# Patient Record
Sex: Female | Born: 1970 | Race: Asian | Hispanic: No | Marital: Single | Smoking: Never smoker
Health system: Southern US, Community
[De-identification: ages and names within clinical notes are randomized; demographics above are authoritative.]

## PROBLEM LIST (undated history)

## (undated) DIAGNOSIS — Z860100 Personal history of colon polyps, unspecified: Secondary | ICD-10-CM

## (undated) DIAGNOSIS — K648 Other hemorrhoids: Secondary | ICD-10-CM

## (undated) DIAGNOSIS — T7840XA Allergy, unspecified, initial encounter: Secondary | ICD-10-CM

## (undated) DIAGNOSIS — K219 Gastro-esophageal reflux disease without esophagitis: Secondary | ICD-10-CM

## (undated) DIAGNOSIS — K760 Fatty (change of) liver, not elsewhere classified: Secondary | ICD-10-CM

## (undated) HISTORY — DX: Other hemorrhoids: K64.8

## (undated) HISTORY — DX: Gastro-esophageal reflux disease without esophagitis: K21.9

## (undated) HISTORY — DX: Fatty (change of) liver, not elsewhere classified: K76.0

## (undated) HISTORY — DX: Personal history of colon polyps, unspecified: Z86.0100

## (undated) HISTORY — DX: Personal history of colonic polyps: Z86.010

## (undated) HISTORY — DX: Allergy, unspecified, initial encounter: T78.40XA

---

## 2009-03-25 ENCOUNTER — Ambulatory Visit: Payer: Self-pay | Admitting: Internal Medicine

## 2009-03-25 DIAGNOSIS — J309 Allergic rhinitis, unspecified: Secondary | ICD-10-CM | POA: Insufficient documentation

## 2009-03-25 DIAGNOSIS — R1084 Generalized abdominal pain: Secondary | ICD-10-CM

## 2009-03-25 DIAGNOSIS — K625 Hemorrhage of anus and rectum: Secondary | ICD-10-CM

## 2009-03-25 LAB — CONVERTED CEMR LAB
ALT: 104 units/L — ABNORMAL HIGH (ref 0–35)
AST: 58 units/L — ABNORMAL HIGH (ref 0–37)
CO2: 26 meq/L (ref 19–32)
Calcium: 8.8 mg/dL (ref 8.4–10.5)
Creatinine, Ser: 0.43 mg/dL (ref 0.40–1.20)
Eosinophils Relative: 1 % (ref 0–5)
Glucose, Bld: 89 mg/dL (ref 70–99)
HCT: 39.5 % (ref 36.0–46.0)
Hemoglobin: 13.1 g/dL (ref 12.0–15.0)
Indirect Bilirubin: 0.5 mg/dL (ref 0.0–0.9)
Monocytes Relative: 7 % (ref 3–12)
Neutro Abs: 2.6 10*3/uL (ref 1.7–7.7)
RDW: 13 % (ref 11.5–15.5)
Total Bilirubin: 0.6 mg/dL (ref 0.3–1.2)
Total Protein: 8 g/dL (ref 6.0–8.3)
WBC: 5.8 10*3/uL (ref 4.0–10.5)

## 2009-03-26 ENCOUNTER — Encounter: Payer: Self-pay | Admitting: Internal Medicine

## 2009-03-26 ENCOUNTER — Telehealth: Payer: Self-pay | Admitting: Internal Medicine

## 2009-03-26 DIAGNOSIS — R740 Nonspecific elevation of levels of transaminase and lactic acid dehydrogenase [LDH]: Secondary | ICD-10-CM

## 2009-03-26 LAB — CONVERTED CEMR LAB
Saturation Ratios: 25 % (ref 20–55)
TIBC: 382 ug/dL (ref 250–470)
UIBC: 287 ug/dL

## 2009-03-29 ENCOUNTER — Encounter (INDEPENDENT_AMBULATORY_CARE_PROVIDER_SITE_OTHER): Payer: Self-pay | Admitting: *Deleted

## 2009-04-06 ENCOUNTER — Ambulatory Visit: Payer: Self-pay | Admitting: Diagnostic Radiology

## 2009-04-06 ENCOUNTER — Ambulatory Visit (HOSPITAL_BASED_OUTPATIENT_CLINIC_OR_DEPARTMENT_OTHER): Admission: RE | Admit: 2009-04-06 | Discharge: 2009-04-06 | Payer: Self-pay | Admitting: Internal Medicine

## 2009-04-27 ENCOUNTER — Ambulatory Visit: Payer: Self-pay | Admitting: Internal Medicine

## 2009-04-27 ENCOUNTER — Telehealth: Payer: Self-pay | Admitting: Gastroenterology

## 2009-04-27 DIAGNOSIS — K7689 Other specified diseases of liver: Secondary | ICD-10-CM

## 2009-04-27 DIAGNOSIS — N390 Urinary tract infection, site not specified: Secondary | ICD-10-CM

## 2009-04-27 DIAGNOSIS — A048 Other specified bacterial intestinal infections: Secondary | ICD-10-CM | POA: Insufficient documentation

## 2009-04-27 LAB — CONVERTED CEMR LAB
Bilirubin Urine: NEGATIVE
Glucose, Urine, Semiquant: NEGATIVE
Nitrite: NEGATIVE
Protein, U semiquant: NEGATIVE
Urobilinogen, UA: 0.2
pH: 6.5

## 2009-04-28 ENCOUNTER — Encounter: Payer: Self-pay | Admitting: Internal Medicine

## 2009-04-29 ENCOUNTER — Ambulatory Visit: Payer: Self-pay | Admitting: Internal Medicine

## 2009-04-29 DIAGNOSIS — R195 Other fecal abnormalities: Secondary | ICD-10-CM

## 2009-04-29 DIAGNOSIS — R935 Abnormal findings on diagnostic imaging of other abdominal regions, including retroperitoneum: Secondary | ICD-10-CM

## 2009-04-29 DIAGNOSIS — R1013 Epigastric pain: Secondary | ICD-10-CM | POA: Insufficient documentation

## 2009-05-03 LAB — CONVERTED CEMR LAB
Bilirubin, Direct: 0.1 mg/dL (ref 0.0–0.3)
HDL: 32.7 mg/dL — ABNORMAL LOW (ref >39.00)
LDL Cholesterol: 88 mg/dL (ref 0–99)
Total Bilirubin: 0.9 mg/dL (ref 0.3–1.2)
Total CHOL/HDL Ratio: 5
Triglycerides: 165 mg/dL — ABNORMAL HIGH (ref 0.0–149.0)
VLDL: 33 mg/dL (ref 0.0–40.0)

## 2009-05-10 ENCOUNTER — Ambulatory Visit: Payer: Self-pay | Admitting: Internal Medicine

## 2009-05-10 ENCOUNTER — Ambulatory Visit (HOSPITAL_BASED_OUTPATIENT_CLINIC_OR_DEPARTMENT_OTHER): Admission: RE | Admit: 2009-05-10 | Discharge: 2009-05-10 | Payer: Self-pay | Admitting: Internal Medicine

## 2009-05-10 ENCOUNTER — Telehealth: Payer: Self-pay | Admitting: Internal Medicine

## 2009-05-10 ENCOUNTER — Ambulatory Visit: Payer: Self-pay | Admitting: Diagnostic Radiology

## 2009-05-10 DIAGNOSIS — R109 Unspecified abdominal pain: Secondary | ICD-10-CM

## 2009-05-10 LAB — CONVERTED CEMR LAB
Specific Gravity, Urine: <1.005
Urobilinogen, UA: 0.2
WBC Urine, dipstick: NEGATIVE
pH: 7.5

## 2009-05-11 ENCOUNTER — Telehealth: Payer: Self-pay | Admitting: Internal Medicine

## 2009-06-07 ENCOUNTER — Telehealth: Payer: Self-pay | Admitting: Internal Medicine

## 2009-06-28 ENCOUNTER — Telehealth: Payer: Self-pay | Admitting: Internal Medicine

## 2009-07-19 ENCOUNTER — Ambulatory Visit: Payer: Self-pay | Admitting: Internal Medicine

## 2009-07-20 ENCOUNTER — Encounter: Payer: Self-pay | Admitting: Internal Medicine

## 2010-01-11 ENCOUNTER — Ambulatory Visit: Admit: 2010-01-11 | Payer: Self-pay | Admitting: Internal Medicine

## 2010-02-01 NOTE — Miscellaneous (Signed)
Summary: nexium rx.  Clinical Lists Changes  Medications: Added new medication of NEXIUM 40 MG  CPDR (ESOMEPRAZOLE MAGNESIUM) 1 capsule each day 30 minutes before meal - Signed Rx of NEXIUM 40 MG  CPDR (ESOMEPRAZOLE MAGNESIUM) 1 capsule each day 30 minutes before meal;  #30 x 11;  Signed;  Entered by: Darlyn Read RN;  Authorized by: Hilarie Fredrickson MD;  Method used: Electronically to [facility] [address], [place], [place]  [address], Ph: [phone], Fax: [phone]    Prescriptions: NEXIUM 40 MG  CPDR (ESOMEPRAZOLE MAGNESIUM) 1 capsule each day 30 minutes before meal  #30 x 11   Entered by:   Darlyn Read RN   Authorized by:   Hilarie Fredrickson MD   Signed by:   Darlyn Read RN on 07/19/2009   Method used:   Electronically to        [facility] [address] (retail)       [address]       [place], [place]  [address]       Ph: [phone]       Fax: [phone]   RxID:   [id number]

## 2010-02-01 NOTE — Letter (Signed)
Summary: [facility] Instructions  [facility]  [address] [place], [place] [address]   Phone: [phone]  Fax: [phone]       Rachael Harmon    1970/05/19    MRN: [id number]        Procedure Day /Date:06-07-09     Arrival Time: 10 AM     Procedure Time: 11:00 AM     Location of Procedure:                    X    [facility] (4th Floor)                       PREPARATION FOR COLONOSCOPY WITH MOVIPREP   Starting 5 days prior to your procedure 06-02-09 do not eat nuts, seeds, popcorn, corn, beans, peas,  salads, or any raw vegetables.  Do not take any fiber supplements (e.g. Metamucil, Citrucel, and Benefiber).  THE DAY BEFORE YOUR PROCEDURE         DATE: 06-06-09 DAY: Sunday  1.  Drink clear liquids the entire day-NO SOLID FOOD  2.  Do not drink anything colored red or purple.  Avoid juices with pulp.  No orange juice.  3.  Drink at least 64 oz. (8 glasses) of fluid/clear liquids during the day to prevent dehydration and help the prep work efficiently.  CLEAR LIQUIDS INCLUDE: Water Jello Ice Popsicles Tea (sugar ok, no milk/cream) Powdered fruit flavored drinks Coffee (sugar ok, no milk/cream) Gatorade Juice: apple, white grape, white cranberry  Lemonade Clear bullion, consomm, broth Carbonated beverages (any kind) Strained chicken noodle soup Hard Candy                             4.  In the morning, mix first dose of MoviPrep solution:    Empty 1 Pouch A and 1 Pouch B into the disposable container    Add lukewarm drinking water to the top line of the container. Mix to dissolve    Refrigerate (mixed solution should be used within 24 hrs)  5.  Begin drinking the prep at 5:00 p.m. The MoviPrep container is divided by 4 marks.   Every 15 minutes drink the solution down to the next mark (approximately 8 oz) until the full liter is complete.   6.  Follow completed prep with 16 oz of clear liquid of your choice (Nothing red or purple).  Continue  to drink clear liquids until bedtime.  7.  Before going to bed, mix second dose of MoviPrep solution:    Empty 1 Pouch A and 1 Pouch B into the disposable container    Add lukewarm drinking water to the top line of the container. Mix to dissolve    Refrigerate  THE DAY OF YOUR PROCEDURE      DATE: 06-07-09 DAY: Monday  Beginning at 6:00 AM  (5 hours before procedure):         1. Every 15 minutes, drink the solution down to the next mark (approx 8 oz) until the full liter is complete.  2. Follow completed prep with 16 oz. of clear liquid of your choice.    3. You may drink clear liquids until 9:00 AM  (2 HOURS BEFORE PROCEDURE).   MEDICATION INSTRUCTIONS  Unless otherwise instructed, you should take regular prescription medications with a small sip of water   as early as possible the morning of your  procedure.  Diabetic patients - see separate instructions.  Stop taking Plavix or Aggrenox on  _  _  (7 days before procedure).     Stop taking Coumadin on  _ _  (5 days before procedure).  Additional medication instructions: _         OTHER INSTRUCTIONS  You will need a responsible adult at least 38 years of age to accompany you and drive you home.   This person must remain in the waiting room during your procedure.  Wear loose fitting clothing that is easily removed.  Leave jewelry and other valuables at home.  However, you may wish to bring a book to read or  an iPod/MP3 player to listen to music as you wait for your procedure to start.  Remove all body piercing jewelry and leave at home.  Total time from sign-in until discharge is approximately 2-3 hours.  You should go home directly after your procedure and rest.  You can resume normal activities the  day after your procedure.  The day of your procedure you should not:   Drive   Make legal decisions   Operate machinery   Drink alcohol   Return to work  You will receive specific instructions about  eating, activities and medications before you leave.    The above instructions have been reviewed and explained to me by   _______________________    I fully understand and can verbalize these instructions _____________________________ Date _________

## 2010-02-01 NOTE — Procedures (Signed)
Summary: Colonoscopy  Patient: [name] Note: All result statuses are Final unless otherwise noted.  Tests: (1) Colonoscopy (COL)   COL Colonoscopy           DONE     [facility]     [address]     [place], [place]  [address]           COLONOSCOPY PROCEDURE REPORT           PATIENT:  Rachael Harmon, Rachael Harmon  MR#:  [id number]     BIRTHDATE:  [birth year], 39 yrs. old  GENDER:  female     ENDOSCOPIST:  Wilhemina Bonito. Eda Keys, MD     REF. BY:  Thomos Lemons, DO     PROCEDURE DATE:  07/19/2009     PROCEDURE:  Colonoscopy with snare polypectomy x 4     ASA CLASS:  Class I     INDICATIONS:  rectal bleeding, heme positive stool     MEDICATIONS:   Fentanyl 50 mcg IV, Versed 6 mg IV           DESCRIPTION OF PROCEDURE:   After the risks benefits and     alternatives of the procedure were thoroughly explained, informed     consent was obtained.  Digital rectal exam was performed and     revealed no abnormalities.   The Pentax Colonoscope [id number]     endoscope was introduced through the anus and advanced to the     cecum, which was identified by both the appendix and ileocecal     valve, without limitations.Time to cecum = 1:59 min.  The quality     of the prep was excellent, using MoviPrep.  The instrument was     then slowly withdrawn (time = 14:03 min) as the colon was fully     examined.     <<PROCEDUREIMAGES>>           FINDINGS:  Two polyps measuring 5mm and 6mm were found in the     ascending colon. Polyps were snared without cautery. Retrieval was     successful.   Two pedunculated polyps were found in the rectum     (15mm) and sigmoid colon (10mm). Polyps were snared, then     cauterized with monopolar cautery. Retrieval was successful.     This was otherwise a normal examination of the colon.  The terminal     ileum appeared normal.   Retroflexed views in the rectum revealed     no abnormalities.    The scope was then withdrawn from the patient     and the procedure completed.        COMPLICATIONS:  None     ENDOSCOPIC IMPRESSION:     1) Two polyps in the ascending colon - removed     2) Two polyps in the rectum and sigmoid colon - removed     3) Otherwise normal examination     4) Normal terminal ileum           RECOMMENDATIONS:     1) Follow up colonoscopy in 3 years     2) YOUR FIRST DEGREE RELATIVES AGE 44 OR OLDER SHOULD BE SCREENED     FOR COLON CANCER AND COLON POLYPS           ______________________________     Wilhemina Bonito. Eda Keys, MD           CC:  Thomos Lemons, DO; The Patient  n.     eSIGNED:   Wilhemina Bonito. Eda Keys at 07/19/2009 11:50 AM           Marcelline Mates, [id number]  Note: An exclamation mark (!) indicates a result that was not dispersed into the flowsheet. Document Creation Date: 07/19/2009 11:52 AM _______________________________________________________________________  (1) Order result status: Final Collection or observation date-time: 07/19/2009 11:41 Requested date-time:  Receipt date-time:  Reported date-time:  Referring Physician:   Ordering Physician: Fransico Setters [phone]) Specimen Source:  Source: Launa Grill Order Number: [id number] Lab site:   Appended Document: Colonoscopy recall 3 yrs     Procedures Next Due Date:    Colonoscopy: 07/2012

## 2010-02-01 NOTE — Assessment & Plan Note (Signed)
Summary: rectal bldg / H.Pylori / LFT'S   History of Present Illness Visit Type: new patient  Primary GI MD: Yancey Flemings MD Primary Provider: Dondra Spry DO Requesting Provider: n/a Chief Complaint: Consult colon. Pt c/o generalized abd pain, lower back pain, and rectal bleeding. History of Present Illness:   PLEASANT 40 Y.O CHINESE FEMALE,WHO IS NEW TO GI TODAY. SHE COMES WITH AN INTERPRETER. SHE HAS LIVED IN THE [place] 12 YEARS, AND DOES UNDERSTAND SOME ENGLISH. SHE HAS C/O INTERMITTENT RECTAL BLEEDING-REPORTS SEEING BRB ON TNE TISSUE AND MIXED WITH HER STOOL 5 X OVER THE PAST MONTH, HAS SEEN LESS FREQUENTLY OVER THE PAST 6 MONTHS. BM'S HAVE BEEN NORMAL. HER APPETITE IS GOOD, WEIGHT HAS BEEN STABLE. SHE ASLO C/O EPIGASTRIC AND SUBXYPHOID PAIN. SHE HAD LABS DONE THRU DR. YOO ,AND H.PYLORI AB TITER IS HIGH, LFTS SHOW MILD TRANSAMINITIS WITH SGOT58,SGPT104,CBC NORMAL.  ABDOMINAL US SHOWS FATTY CHANGES IN THE LIVER, A GB POLYP, PANCREAS NOT WELL SEEN.   GI Review of Systems    Reports abdominal pain, belching, and  heartburn.     Location of  Abdominal pain: epigastric area.    Denies acid reflux, bloating, chest pain, dysphagia with liquids, dysphagia with solids, loss of appetite, nausea, vomiting, vomiting blood, and  weight loss.      Reports heme positive stool and  rectal bleeding.     Denies anal fissure, black tarry stools, change in bowel habit, constipation, diarrhea, diverticulosis, fecal incontinence, hemorrhoids, irritable bowel syndrome, jaundice, light color stool, liver problems, and  rectal pain.    Current Medications (verified): 1)  Nasonex 50 Mcg/act Susp (Mometasone Furoate) .... 2 Sprays Each Nostril Once Daily 2)  Cefuroxime Axetil 500 Mg Tabs (Cefuroxime Axetil) .... One By Mouth Two Times A Day  Allergies (verified): No Known Drug Allergies  Past History:  Past Medical History:   ALLERGIC RHINITIS (ICD-477.9)  Past Surgical History: Unremarkable  Family  History: Reviewed history from 03/25/2009 and no changes required. Father has liver cyst/tumor? Family History High cholesterol - mother  Family History Hypertension - mother no colon ca no stomach ca  Social History: Reviewed history from 03/25/2009 and no changes required. Occupation: Conservation officer, nature in [employer] Single  1 son  age 61 - lives with grandparents in [place] Never Smoked Alcohol use-no  Review of Systems       The patient complains of back pain.  The patient denies allergy/sinus, anemia, anxiety-new, arthritis/joint pain, blood in urine, breast changes/lumps, change in vision, confusion, cough, coughing up blood, depression-new, fainting, fatigue, fever, headaches-new, hearing problems, heart murmur, heart rhythm changes, itching, menstrual pain, muscle pains/cramps, night sweats, nosebleeds, pregnancy symptoms, shortness of breath, skin rash, sleeping problems, sore throat, swelling of feet/legs, swollen lymph glands, thirst - excessive , urination - excessive , urination changes/pain, urine leakage, vision changes, and voice change.         ROS OTHERWISE AS IN HPI  Vital Signs:  Patient profile:   40 year old female Height:      62.5 inches Weight:      144 pounds BMI:     26.01 BSA:     1.67 Pulse rate:   64 / minute Pulse rhythm:   regular BP sitting:   100 / 76  (left arm) Cuff size:   regular  Vitals Entered By: Ok Anis CMA (April 29, 2009 9:35 AM)  Physical Exam  General:  Well developed, well nourished, no acute distress.short statured.   Head:  Normocephalic and  atraumatic. Eyes:  PERRLA, no icterus. Lungs:  Clear throughout to auscultation. Heart:  Regular rate and rhythm; no murmurs, rubs,  or bruits. Abdomen:  SOFT, MOLD TENDERNESS IN EPIGASTRIUM, NO GUARDING OR REBOUND, NO PALP MASS OR HSM,BS+ Rectal:  NOT REPEATED, HEME POSITIVE PER DR. Charmayne Sheer HEMORRHOIDS Extremities:  No clubbing, cyanosis, edema or deformities noted. Neurologic:  Alert and   oriented x4;  grossly normal neurologically. Psych:  Alert and cooperative. Normal mood and affect.   Impression & Recommendations:  Problem # 1:  FECAL OCCULT BLOOD (ICD-792.1) Assessment New 39 YO FEMALE WITH INTERMITTENT RECTAL BLEEDING X 6 MONTHS,WITH HEME POSITIVE STOOL ON EXAM . R/O HEMORRHOIDAL SOURCE ,R/O OCCULT LESION.  SCHEDULE FOR COLONOSCOPY WITH DR. PERRY. PROCEDURE WAS DISCUSSED IN DETAIL WITH THE PATIENT VIA HER INTERPRETER. THE INTERPRETER WILL ACCOMPANY HER ON THE DAY OF EXAM AS WELL.  Problem # 2:  ABDOMINAL PAIN, EPIGASTRIC (ICD-789.06) Assessment: New SEVERAL MONTH HX OF INTERMITTENT EPIGASTRIC /SUBXYPHOID DISCOMFORT WITH + H.PYLORI STATUS. R/O GASTRITIS,PUD,GERD.  START TRIAL OF NEXIUM 40 MG ONCE DAILY IN AM -SAMPES GIVEN SCHEDULE FOR UPPER ENDOSCOPY WITH DR. PERRY. PROCEDURE DISCUSSED IN DETAIL WITH PT. Orders: Colon/Endo (Colon/Endo)  Problem # 3:  TRANSAMINASES, SERUM, ELEVATED (ICD-790.4) Assessment: Unchanged MILD TRANSAMINITIS OF UNCERTAIN DURATION,WITH INCREASED ECHOGENICITY OF LIVER ON [place]. R/O NASH, R/O OTHER UNDERLYING LIVER DISEASE.  HEPATITS  B AND C SEROLOGIES NEGATIVE  LABS AS BELOW Orders: Colon/Endo (Colon/Endo) TLB-Lipid Panel (80061-LIPID) TLB-Hepatic/Liver Function Pnl (80076-HEPATIC) TLB-CRP-High Sensitivity (C-Reactive Protein) (86140-FCRP) T-Alpha-1-Antitrypsin Tot ([address]) T-AMA [phone]) Jackie Plum ([id number])  Other Orders: T-Anti SMA ([id number]) T-Ceruloplasmin ([id number])  Patient Instructions: 1)  Your physician has requested that you have the following labwork done today: Go to lab , basement level. 2)  We have given you Nexium samples, take 1 capsule 30 min before breakfast. 3)  We sent perscription for the Colonoscopy prep to your pharmacy, [facility]. 4)  We scheduled the Endoscopy/Colonoscopy with Dr. Marina Goodell for 06-07-09 at 11Am. 5)  Directions and brochure given. 6)  Copy sent to :  D. Thomos Lemons, DO 7)  The medication list was reviewed and reconciled.  All changed / newly prescribed medications were explained.  A complete medication list was provided to the patient / caregiver. Prescriptions: NEXIUM 40 MG CPDR (ESOMEPRAZOLE MAGNESIUM) Take 1 capsule 30 min before breakfast  #30 x 3   Entered by:   Lowry Ram NCMA   Authorized by:   Sammuel Cooper PA-c   Signed by:   Lowry Ram NCMA on 04/29/2009   Method used:   Electronically to        [facility] [address] (retail)       [address]       [place], [place]  [address]       Ph: [phone]       Fax: [phone]   RxID:   [id number] MOVIPREP 100 GM  SOLR (PEG-KCL-NACL-NASULF-NA ASC-C) As per prep instructions.  #1 x 0   Entered by:   Lowry Ram NCMA   Authorized by:   Sammuel Cooper PA-c   Signed by:   Lowry Ram NCMA on 04/29/2009   Method used:   Electronically to        [facility] [address] (retail)       [address]  [place], [place]  [address]       Ph: [phone]       Fax: [phone]   RxID:   [id number]

## 2010-02-01 NOTE — Assessment & Plan Note (Signed)
Summary: new to be est, npx- jr   Vital Signs:  Patient profile:   40 year old Harmon Height:      62.5 inches Weight:      144.50 pounds BMI:     26.10 O2 Sat:      100 % on Room air Temp:     97.9 degrees F oral Pulse rate:   65 / minute Pulse rhythm:   regular Resp:     16 per minute BP sitting:   104 / 74  (right arm) Cuff size:   regular  Vitals Entered By: Glendell Docker CMA (March 25, 2009 9:35 AM)  O2 Flow:  Room air CC: Rm 2- New Patient Comments blood in stool for the past 6 months- denies constipation, urinary incontinence-cough,laughing and sneezing, fasting for labs   Primary Care Provider:  Dondra Spry DO  CC:  Rm 2- New Patient.  History of Present Illness: Rachael Harmon to establish No previous PCP moved to [place] 12 yrs ago  She c/o blood tinged stools,  onset 5-6 months tissue is stained with blood,  small amt of blood in commode  c/o upper intermittent upper abd pain, feels like something is blocked no dyphagia no NSAID use  she c/o chronic nasal congestion throat is filled with mucus mucus can be blood tinged some nose bleeds   Preventive Screening-Counseling & Management  Alcohol-Tobacco     Alcohol drinks/day: 0     Smoking Status: never  Caffeine-Diet-Exercise     Caffeine use/day: None     Does Patient Exercise: no  Allergies (verified): No Known Drug Allergies  Family History: Father has liver cyst/tumor? Family History High cholesterol - mother  Family History Hypertension - mother no colon ca no stomach ca  Social History: Occupation: Geographical information systems officer Single  1 son  age 56 - lives with grandparents in [place] Never Smoked Alcohol use-no Smoking Status:  never Caffeine use/day:  None Does Patient Exercise:  no  Review of Systems       no difficulty swallowing,  no dysphagia occasional thoracic back pain  Physical Exam  General:  alert, well-developed, and well-nourished.   Ears:  R ear normal and L ear  normal.   Nose:  nasal dischargemucosal pallor and mucosal edema.   Mouth:  mild pharyngeal erythema and postnasal drip.   Neck:  supple, no masses, and no thyromegaly.   Lungs:  normal respiratory effort, normal breath sounds, no crackles, and no wheezes.   Heart:  normal rate, regular rhythm, no murmur, and no gallop.   Abdomen:  soft and no masses.  mild left upper quad and right upper quad tenderness.  mild right lower quad tenderness Rectal:  normal sphincter tone, no masses, small external hemorrhoid(s), and small internal hemorrhoid(s).  stool negative for occult blood   Extremities:  No lower extremity edema  Neurologic:  cranial nerves II-XII intact and gait normal.   Psych:  normally interactive, good eye contact, not anxious appearing, and not depressed appearing.     Impression & Recommendations:  Problem # 1:  ABDOMINAL PAIN, GENERALIZED (ICD-789.07) 40 y/o with intermittent abd pain / dyspepsia.  trial of otc zantac.  screen for H. Pylori Orders: T-Basic Metabolic Panel [phone]) T-Hepatic Function [id number]) T-CBC w/Diff [id number]) T-TSH [phone]) T- * Misc. Laboratory test [phone])  Problem # 2:  RECTAL BLEEDING (ICD-569.3) Pt with intermittent rectal bleeding.  I suspect bleeding from hemorrhoids.  increase fiber.  use hemorrhoid cream.  If persistent symptoms, refer to GI  Problem # 3:  ALLERGIC RHINITIS (ICD-477.9)  Her updated medication list for this problem includes:    Fexofenadine Hcl 180 Mg Tabs (Fexofenadine hcl) ..... One by mouth once daily    Nasonex 50 Mcg/act Susp (Mometasone furoate) .Marland Kitchen... 2 sprays each nostril once daily  Discussed use of allergy medications and environmental measures.   Complete Medication List: 1)  Fexofenadine Hcl 180 Mg Tabs (Fexofenadine hcl) .... One by mouth once daily 2)  Nasonex 50 Mcg/act Susp (Mometasone furoate) .... 2 sprays each nostril once daily 3)  Ranitidine Hcl 150 Mg Tabs (Ranitidine hcl) ....  One by mouth two times a day 4)  Hydrocortisone Ace-pramoxine 1-1 % Crea (Hydrocortisone ace-pramoxine) .... Apply to rectal area 2 x per day x  1 week  Patient Instructions: 1)  Use zantac(ranitidine) 150 mg by mouth two times a day 2)  Use fiber supplement (metamucile)  1 tsp once daily with 8 oz of water once daily 3)  Please schedule a follow-up appointment in 1 month. 4)  Wash rectal area with warm water and soap after bowel movement Prescriptions: HYDROCORTISONE ACE-PRAMOXINE 1-1 % CREA (HYDROCORTISONE ACE-PRAMOXINE) apply to rectal area 2 x per day x  1 week  #30 grams x 0   Entered and Authorized by:   D. Thomos Lemons DO   Signed by:   D. Thomos Lemons DO on 03/25/2009   Method used:   Print then Give to Patient   RxID:   [id number] RANITIDINE HCL 150 MG TABS (RANITIDINE HCL) one by mouth two times a day  #60 x 2   Entered and Authorized by:   D. Thomos Lemons DO   Signed by:   D. Thomos Lemons DO on 03/25/2009   Method used:   Print then Give to Patient   RxID:   [id number] NASONEX 50 MCG/ACT SUSP (MOMETASONE FUROATE) 2 sprays each nostril once daily  #1 x 3   Entered and Authorized by:   D. Thomos Lemons DO   Signed by:   D. Thomos Lemons DO on 03/25/2009   Method used:   Print then Give to Patient   RxID:   [id number] FEXOFENADINE HCL 180 MG TABS (FEXOFENADINE HCL) one by mouth once daily  #30 x 5   Entered and Authorized by:   D. Thomos Lemons DO   Signed by:   D. Thomos Lemons DO on 03/25/2009   Method used:   Print then Give to Patient   RxID:   [id number]    Preventive Care Screening  Pap Smear:    Date:  10/20/2008    Results:  normal    Current Allergies (reviewed today): No known allergies

## 2010-02-01 NOTE — Procedures (Signed)
Summary: Upper Endoscopy  Patient: [name] Note: All result statuses are Final unless otherwise noted.  Tests: (1) Upper Endoscopy (EGD)   EGD Upper Endoscopy       DONE     [facility]     [address]     [place], [place]  [address]           ENDOSCOPY PROCEDURE REPORT           PATIENT:  Rachael Harmon, Rachael Harmon  MR#:  [id number]     BIRTHDATE:  24-Feb-1970, 39 yrs. old  GENDER:  female           ENDOSCOPIST:  Wilhemina Bonito. Eda Keys, MD     Referred by:  Thomos Lemons, DO           PROCEDURE DATE:  07/19/2009     PROCEDURE:  EGD with biopsy     ASA CLASS:  Class I     INDICATIONS:  epigastric pain ; has GERD; improved w/ meals;     elevated H.pylori titer           MEDICATIONS:   There was residual sedation effect present from     prior procedure., Fentanyl 25 mcg IV, Versed 2 mg IV     TOPICAL ANESTHETIC:  Exactacain Spray           DESCRIPTION OF PROCEDURE:   After the risks benefits and     alternatives of the procedure were thoroughly explained, informed     consent was obtained.  The LB [id number] endoscope was     introduced through the mouth and advanced to the third portion of     the duodenum, without limitations.  The instrument was slowly     withdrawn as the mucosa was fully examined.     <<PROCEDUREIMAGES>>           The upper, middle, and distal third of the esophagus were     carefully inspected and no abnormalities were noted. The z-line     was well seen at the GEJ. The endoscope was pushed into the fundus     which was normal including a retroflexed view. The antrum,gastric     body, first and second part of the duodenum were essentially     unremarkable. There was an incidental 9mm submucosal antral nodule     present.   Retroflexed views revealed no abnormalities.    The     scope was then withdrawn from the patient and the procedure     completed.           COMPLICATIONS:  None           ENDOSCOPIC IMPRESSION:     1) Essentially Normal EGD     2) Small  incidental antral nodule     3) Gerd           RECOMMENDATIONS:     1) Continue PPI (Nexium) daily     2) Rx CLO if positive     3) Reflux precautions instructions           _____________________________     Wilhemina Bonito. Eda Keys, MD           CC:  Thomos Lemons, DO, The Patient           n.     eSIGNED:   Wilhemina Bonito. Eda Keys at 07/19/2009 12:03 PM           [name],  Tai, [id number]  Note: An exclamation mark (!) indicates a result that was not dispersed into the flowsheet. Document Creation Date: 07/19/2009 12:04 PM _______________________________________________________________________  (1) Order result status: Final Collection or observation date-time: 07/19/2009 11:54 Requested date-time:  Receipt date-time:  Reported date-time:  Referring Physician:   Ordering Physician: Fransico Setters [phone]) Specimen Source:  Source: Launa Grill Order Number: [id number] Lab site:

## 2010-02-01 NOTE — Progress Notes (Signed)
Summary: Triage  Phone Note Call from Patient   Caller: Meliton Rattan Social Work @ [facility] [phone] Call For: Dr. Marina Goodell Summary of Call: Calling about an interpreter report on this pt. Initial call taken by: Karna Christmas,  June 28, 2009 12:15 PM  Follow-up for Phone Call        Asking if there  were  any problems with intrepreter at procedure. Informed pt. cx. procedure. Follow-up by: Teryl Lucy RN,  June 28, 2009 2:23 PM

## 2010-02-01 NOTE — Progress Notes (Signed)
Summary: CT Order  Phone Note Other Incoming   Caller: Cordelia Pen - Radiology Summary of Call: she states she spoke with Dr Loreta Ave and he states stones could only be seen with IV contrast, If a better evaluation of the abdomen is needed, his recommendations would be an MRI. Cordelia Pen is requesting a new order fot CT with IV contrast. Initial call taken by: Glendell Docker CMA,  May 10, 2009 4:31 PM  Follow-up for Phone Call        CT order changed Follow-up by: D. Thomos Lemons DO,  May 10, 2009 5:01 PM

## 2010-02-01 NOTE — Progress Notes (Signed)
Summary: CT result  Phone Note Outgoing Call   Summary of Call: call pt - CT of abd and pelvis normal.  no kidney stone or othe kidney abnormality Initial call taken by: D. Thomos Lemons DO,  May 11, 2009 12:45 PM  Follow-up for Phone Call        Spoke with pt. about results and pt. requested that I contact Trey Paula (friend) @ [phone] and give him results of CT as pt. did not understand me very well.  Left results on Jeff's voicemail.  Mervin Kung CMA  May 11, 2009 4:42 PM

## 2010-02-01 NOTE — Miscellaneous (Signed)
Summary: Orders Update  Clinical Lists Changes  Orders: Added new Test order of TLB-H Pylori Screen Gastric Biopsy (83013-CLOTEST) - Signed 

## 2010-02-01 NOTE — Letter (Signed)
Summary: Patient Notice- Polyp Results  [facility]  [address] [place], [place] [address]   Phone: [phone]  Fax: [phone]        July 20, 2009 MRN: [id number]    Rachael Harmon [address] [place], [place]  [address]    Dear [name],  I am pleased to inform you that the colon polyps removed during your recent colonoscopy were found to be benign (no cancer detected) upon pathologic examination.  I recommend you have a repeat colonoscopy examination in 3 years to look for recurrent polyps, as having colon polyps increases your risk for having recurrent polyps or even colon cancer in the future.  Should you develop new or worsening symptoms of abdominal pain, bowel habit changes or bleeding from the rectum or bowels, please schedule an evaluation with either your primary care physician or with me.  Additional information/recommendations:  __ No further action with gastroenterology is needed at this time. Please      follow-up with your primary care physician for your other healthcare      needs.   Please call us if you are having persistent problems or have questions about your condition that have not been fully answered at this time.  Sincerely,  Hilarie Fredrickson MD  This letter has been electronically signed by your physician.  Appended Document: Patient Notice- Polyp Results letter mailed.

## 2010-02-01 NOTE — Assessment & Plan Note (Signed)
Summary: 1 month follow up/mhf   Vital Signs:  Patient profile:   40 year old female Height:      62.5 inches Weight:      143.75 pounds BMI:     25.97 O2 Sat:      99 % on Room air Temp:     98.2 degrees F oral Pulse rate:   68 / minute Pulse rhythm:   regular Resp:     16 per minute BP sitting:   100 / 70  (right arm) Cuff size:   regular  Vitals Entered By: Glendell Docker CMA (April 27, 2009 9:05 AM)  O2 Flow:  Room air CC: Rm 3- 1 Month Follow up Comments c/o diarrhea red in color x 5 and lower back pain for the past month ,denies urinary problems   Primary Care Provider:  DThomos Lemons DO  CC:  Rm 3- 1 Month Follow up.  History of Present Illness: 40 y/o asian female for fu (history as per Nurse, learning disability) she completed course for H  Pylori general dyspepsia is better  we discussed results of blood tests - elevated LFTs neg Hep B and Hep C antibody,  normal iron saturation we reviewed liver u/s - showed fatty liver poor diet - fried foods  she also co mild low back pain no urinary symptoms  Allergies (verified): No Known Drug Allergies  Family History: Father has liver cyst/tumor? Family History High cholesterol - mother  Family History Hypertension - mother no colon ca no stomach ca  Physical Exam  General:  alert, well-developed, and well-nourished.   Lungs:  normal respiratory effort and normal breath sounds.   Heart:  normal rate, regular rhythm, no murmur, and no gallop.   Abdomen:  soft, non-tender, normal bowel sounds, no masses, no hepatomegaly, and no splenomegaly.     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  dyspepsia improved.  consider EGD to screen for gastric cancer  Orders: Gastroenterology Referral (GI)  Problem # 2:  RECTAL BLEEDING (ICD-569.3) Pt reports rectal bleeding improved after using hemorrhoid cream.  she describes persistent  blood tinged stools esp with loose BMs.  refer to GI for colonoscopy vs sigmoidoscopy  Orders: Gastroenterology Referral (GI)  Problem # 3:  UTI (ICD-599.0) Pt with mild back pain and positive UA.  tx with ceftin.  send urine for culture The following medications were removed from the medication list:    Amoxicillin 500 Mg Tabs (Amoxicillin) .Marland Kitchen... 2 tabs by mouth two times a day for 10 days    Clarithromycin 500 Mg Tabs (Clarithromycin) ..... One by mouth two times a day Her updated medication list for this problem includes:    Cefuroxime Axetil 500 Mg Tabs (Cefuroxime axetil) ..... One by mouth two times a day  Orders: T-Culture, Urine ([id number]) Specimen Handling ([address])  Complete Medication List: 1)  Nasonex 50 Mcg/act Susp (Mometasone furoate) .... 2 sprays each nostril once daily 2)  Cefuroxime Axetil 500 Mg Tabs (Cefuroxime axetil) .... One by mouth two times a day 3)  Moviprep 100 Gm Solr (Peg-kcl-nacl-nasulf-na asc-c) .... As per prep instructions. 4)  Nexium 40 Mg Cpdr (Esomeprazole magnesium) .... Take 1 capsule 30 min before breakfast  Other Orders: UA Dipstick w/o Micro (manual) (95621)  Patient Instructions: 1)  Change your diet as directed 2)  Start vitamin E 800 International Units daily 3)  Please schedule a follow-up appointment in 6 months.  4)  Hepatic Panel prior to visit, ICD-9: 571.8 5)  Please return for lab work one (1) week before your next appointment.  6)  Our office will contact you re:  referral to GI specialist Prescriptions: CEFUROXIME AXETIL 500 MG TABS (CEFUROXIME AXETIL) one by mouth two times a day  #14 x 0   Entered and Authorized by:   D. Thomos Lemons DO   Signed by:   D. Thomos Lemons DO on 04/27/2009   Method used:   Electronically to        [facility] [address] (retail)       [address]       [place], [place]  [address]       Ph: [phone]       Fax: [phone]   RxID:    [id number]   Current Allergies (reviewed today): No known allergies   Laboratory Results   Urine Tests    Routine Urinalysis   Color: yellow Appearance: Cloudy Glucose: negative   (Normal Range: Negative) Bilirubin: negative   (Normal Range: Negative) Ketone: negative   (Normal Range: Negative) Spec. Gravity: 1.015   (Normal Range: 1.003-1.035) Blood: small   (Normal Range: Negative) pH: 6.5   (Normal Range: 5.0-8.0) Protein: negative   (Normal Range: Negative) Urobilinogen: 0.2   (Normal Range: 0-1) Nitrite: negative   (Normal Range: Negative) Leukocyte Esterace: small   (Normal Range: Negative)

## 2010-02-01 NOTE — Progress Notes (Signed)
  Phone Note Outgoing Call   Summary of Call: call pt (translator(- liver enzymes are elevated.  I rec we obtain liver U/S.   Also H. Pylori antibody in positive - I suggest treatment with 2 antibiotics and omeprazole.  see rx needs f/u appt in 2 weeks Initial call taken by: D. Thomos Lemons DO,  March 26, 2009 3:42 PM  Follow-up for Phone Call        Call pt  no answer   left message for return call   Follow-up by: Darral Dash,  March 29, 2009 11:35 AM  Additional Follow-up for Phone Call Additional follow up Details #1::        Left message to return call Additional Follow-up by: Darral Dash,  March 30, 2009 7:59 AM  New Problems: TRANSAMINASES, SERUM, ELEVATED (ICD-790.4)   Additional Follow-up for Phone Call Additional follow up Details #2::    Spoke with family member  message given  U/S  sch'd  April  5  appt confirmed Follow-up by: Darral Dash,  April 01, 2009 3:24 PM  New Problems: TRANSAMINASES, SERUM, ELEVATED (ICD-790.4) New/Updated Medications: AMOXICILLIN 500 MG TABS (AMOXICILLIN) 2 tabs by mouth two times a day for 10 days CLARITHROMYCIN 500 MG TABS (CLARITHROMYCIN) one by mouth two times a day OMEPRAZOLE 20 MG CPDR (OMEPRAZOLE) one by mouth once daily 15 mins before AM meal Prescriptions: OMEPRAZOLE 20 MG CPDR (OMEPRAZOLE) one by mouth once daily 15 mins before AM meal  #30 x 2   Entered and Authorized by:   D. Thomos Lemons DO   Signed by:   D. Thomos Lemons DO on 03/26/2009   Method used:   Electronically to        [facility] [address] (retail)       [address]       [place], [place]  [address]       Ph: [phone]       Fax: [phone]   RxID:   [id number] CLARITHROMYCIN 500 MG TABS (CLARITHROMYCIN) one by mouth two times a day  #20 x 0   Entered and Authorized by:   D. Thomos Lemons DO   Signed by:   D. Thomos Lemons DO on 03/26/2009   Method used:   Electronically to        [facility] [address] (retail)       [address]       [place], [place]  [address]       Ph: [phone]       Fax: [phone]   RxID:   [id number] AMOXICILLIN 500 MG TABS (AMOXICILLIN) 2 tabs by mouth two times a day for 10 days  #40 x 0   Entered and Authorized by:   D. Thomos Lemons DO   Signed by:   D. Thomos Lemons DO on 03/26/2009   Method used:   Electronically to        [facility] [address] (retail)       [address]       [place], [place]  [address]       Ph: [phone]       Fax: [phone]   RxID:   [id number]

## 2010-02-01 NOTE — Letter (Signed)
Summary: Primary Care Consult Scheduled Letter  [place] [facility]  [address]   [place], [place] [address]   Phone: [phone]  Fax: [phone]      03/29/2009 MRN: [id number]  Rachael Harmon [address] [place], [place]  [address]    Dear [name],      We have scheduled an appointment for you.  At the recommendation of Dr. Artist Pais, we have schedulweed you a appointment at  [facility]  on [facility] [address] at [facility] .  Their address is[address], [place] [address]. The office phone number is [phone].  If this appointment day and time is not convenient for you, please feel free to call the office of the doctor you are being referred to at the number listed above and reschedule the appointment.     It is important for you to keep your scheduled appointments. We are here to make sure you are given good patient care. If you have questions or you have made changes to your appointment, please notify us at  [phone], ask for HELEN.    Thank you,  Patient Care Coordinator [place] [facility]

## 2010-02-01 NOTE — Progress Notes (Signed)
Summary: triage  Phone Note From Other Clinic Call back at [phone]   Caller: Rachael Harmon from Dr Artist Pais Call For: [name] (doc of the day) Reason for Call: Schedule Patient Appt Summary of Call: Dr Artist Pais would ilke this patient seen before first available 5-23 do to H-Pylori and rectal bleeding. Initial call taken by: Tawni Levy,  April 27, 2009 11:37 AM  Follow-up for Phone Call        No physician openings tomorrow but PA has openings.Should i schedule pt. with her? Follow-up by: Teryl Lucy RN,  April 27, 2009 1:48 PM  Additional Follow-up for Phone Call Additional follow up Details #1::        i am certain that I have openings (either rov or ngi) before 4 weeks from now.  Check friday may 13th...can offer her one of those or PA appt is also fine. Additional Follow-up by: Rachael Fee MD,  April 27, 2009 1:51 PM    Additional Follow-up for Phone Call Additional follow up Details #2::    Rachael Harmon at [facility] office  asked to have pt. scheduled this week with PA.Given appt. for Thursday.Pt.will bring translator with her and Rachael Harmon will inform pt. of visit.. Follow-up by: Teryl Lucy RN,  April 27, 2009 2:45 PM

## 2010-02-01 NOTE — Progress Notes (Signed)
Summary: cx double procedure (same day)  Phone Note Call from Patient Call back at Home Phone [phone]   Caller: brother Call For: Rachael Harmon Summary of Call: Brother called and cx double proc that was scheduled for today because patient had a family emergency, will cb to resch.  Will you charge? Initial call taken by: Tawni Levy,  June 07, 2009 8:19 AM  Follow-up for Phone Call        No, but it is important that she reschedules in the near future  Follow-up by: Hilarie Fredrickson MD,  June 07, 2009 8:28 AM  Additional Follow-up for Phone Call Additional follow up Details #1::        Patient NOT BILLED. Additional Follow-up by: Leanor Kail [facility],  June 18, 2009 10:23 AM

## 2010-02-01 NOTE — Assessment & Plan Note (Signed)
Summary: ?strep test/not sure /hea   Vital Signs:  Patient profile:   40 year old female Height:      62.5 inches Weight:      144 pounds BMI:     26.01 O2 Sat:      98 % on Room air Temp:     98.0 degrees F oral Pulse rate:   90 / minute Pulse rhythm:   regular Resp:     18 per minute BP sitting:   108 / 78  (right arm) Cuff size:   large  Vitals Entered By: Glendell Docker CMA (May 10, 2009 3:00 PM)  O2 Flow:  Room air CC: Rm 3- Low Back Pain, Back pain Is Patient Diabetic? No Pain Assessment Patient in pain? yes     Location: lower back Intensity: 9 Type: stinging Onset of pain  Constant   Primary Care Provider:  Dondra Spry DO  CC:  Rm 3- Low Back Pain and Back pain.  History of Present Illness:  Back Pain      This is a 40 year old woman who presents with Back pain.  The patient denies fever and chills.  The pain is located in the right low back and left low back.  she notes urinary freq x 5 days some relief with aleve  elevated LFTs , rectal bleeding,  GERD - GI notes reviewed    Allergies: No Known Drug Allergies  Past History:  Past Medical History:   ALLERGIC RHINITIS (ICD-477.9)    Past Surgical History: Unremarkable    Family History: Father has liver cyst/tumor? Family History High cholesterol - mother  Family History Hypertension - mother no colon ca no stomach ca     Social History: Occupation: Geographical information systems officer Single  1 son  age 41 - lives with grandparents in [place] Never Smoked  Alcohol use-no   Physical Exam  General:  alert, well-developed, and well-nourished.   Lungs:  normal respiratory effort, normal breath sounds, no crackles, and no wheezes.   Heart:  normal rate, regular rhythm, and no gallop.   Abdomen:  soft.  mild CVA tenderness bilaterally   Impression & Recommendations:  Problem # 1:  FLANK PAIN (ICD-789.09) 40 y/o Asian female with left flank pain.  probable musculoskelatal strain.  rule out kidney  stone  Her updated medication list for this problem includes:    Metaxalone 800 Mg Tabs (Metaxalone) ..... One by mouth three times a day as needed for back pain  Orders: CT with Contrast (CT w/ contrast) T-Culture, Urine ([id number])  Complete Medication List: 1)  Nasonex 50 Mcg/act Susp (Mometasone furoate) .... 2 sprays each nostril once daily 2)  Nexium 40 Mg Cpdr (Esomeprazole magnesium) .... Take 1 capsule 30 min before breakfast 3)  Metaxalone 800 Mg Tabs (Metaxalone) .... One by mouth three times a day as needed for back pain  Other Orders: Specimen Handling ([address])  Patient Instructions: 1)  Call our office if your symptoms do not  improve or gets worse. 2)  Please schedule a follow-up appointment in 2 weeks. Prescriptions: METAXALONE 800 MG TABS (METAXALONE) one by mouth three times a day as needed for back pain  #21 x 0   Entered and Authorized by:   D. Thomos Lemons DO   Signed by:   D. Thomos Lemons DO on 05/10/2009   Method used:   Electronically to        [facility] [address] [address] (retail)  [address]       [place], [place]  [address]       Ph: [phone]       Fax: [phone]   RxID:   [id number]   Laboratory Results   Urine Tests    Routine Urinalysis   Color: yellow Appearance: Clear Glucose: negative   (Normal Range: Negative) Bilirubin: negative   (Normal Range: Negative) Ketone: negative   (Normal Range: Negative) Spec. Gravity: <1.005   (Normal Range: 1.003-1.035) Blood: trace-intact   (Normal Range: Negative) pH: 7.5   (Normal Range: 5.0-8.0) Protein: negative   (Normal Range: Negative) Urobilinogen: 0.2   (Normal Range: 0-1) Nitrite: negative   (Normal Range: Negative) Leukocyte Esterace: negative   (Normal Range: Negative)

## 2010-02-08 ENCOUNTER — Ambulatory Visit: Payer: Self-pay | Admitting: Internal Medicine

## 2010-03-08 ENCOUNTER — Ambulatory Visit: Payer: Self-pay | Admitting: Internal Medicine

## 2010-03-08 ENCOUNTER — Other Ambulatory Visit: Payer: BC Managed Care – PPO

## 2010-03-08 ENCOUNTER — Ambulatory Visit (INDEPENDENT_AMBULATORY_CARE_PROVIDER_SITE_OTHER): Payer: BC Managed Care – PPO | Admitting: Internal Medicine

## 2010-03-08 ENCOUNTER — Other Ambulatory Visit: Payer: Self-pay | Admitting: Internal Medicine

## 2010-03-08 ENCOUNTER — Encounter: Payer: Self-pay | Admitting: Internal Medicine

## 2010-03-08 DIAGNOSIS — Z860100 Personal history of colon polyps, unspecified: Secondary | ICD-10-CM | POA: Insufficient documentation

## 2010-03-08 DIAGNOSIS — K219 Gastro-esophageal reflux disease without esophagitis: Secondary | ICD-10-CM

## 2010-03-08 DIAGNOSIS — R740 Nonspecific elevation of levels of transaminase and lactic acid dehydrogenase [LDH]: Secondary | ICD-10-CM

## 2010-03-08 LAB — CBC WITH DIFFERENTIAL/PLATELET
Basophils Absolute: 0 10*3/uL (ref 0.0–0.1)
Basophils Relative: 0.4 % (ref 0.0–3.0)
Eosinophils Absolute: 0 10*3/uL (ref 0.0–0.7)
Eosinophils Relative: 0.6 % (ref 0.0–5.0)
Hemoglobin: 12.4 g/dL (ref 12.0–15.0)
Lymphocytes Relative: 28.2 % (ref 12.0–46.0)
Lymphs Abs: 1.7 10*3/uL (ref 0.7–4.0)
Neutro Abs: 3.5 10*3/uL (ref 1.4–7.7)
Neutrophils Relative %: 60.1 % (ref 43.0–77.0)
Platelets: 246 10*3/uL (ref 150.0–400.0)
RDW: 13.8 % (ref 11.5–14.6)
WBC: 5.9 10*3/uL (ref 4.5–10.5)

## 2010-03-08 LAB — COMPREHENSIVE METABOLIC PANEL
ALT: 27 U/L (ref 0–35)
AST: 24 U/L (ref 0–37)
Calcium: 8.8 mg/dL (ref 8.4–10.5)
Chloride: 103 mEq/L (ref 96–112)
Potassium: 3.9 mEq/L (ref 3.5–5.1)

## 2010-03-08 LAB — PROTIME-INR: INR: 1.1 ratio — ABNORMAL HIGH (ref 0.8–1.0)

## 2010-03-15 NOTE — Assessment & Plan Note (Signed)
Summary:  GERD, history of polyps , non-GI complaints   History of Present Illness Visit Type: Follow-up Visit Primary GI MD: Yancey Flemings MD Primary Provider: Marcelline Mates, MD Requesting Provider: n/a Chief Complaint: Patients rectal bleeding is resolved, pt states she is having some chest pain the radiates to both arms. Pt wants to request a chest xray on her lungs and of her liver.  She has had elevated liver test in the past. History of Present Illness:    40 year old [place] female who presents today with an area of complaints , most of which are. non-GI. She is accompanied by a Nurse, learning disability. Patient was initially seen in this office April 2011 regarding rectal bleeding as well as elevated H. pylori antibody and mildly elevated hepatic transaminases. She subsequently underwent colonoscopy and upper endoscopy July 2011. Colonoscopy revealed multiple adenomatous polyps , some of which were large in the rectum and sigmoid colon and responsible for bleeding. no bleeding since her procedure. Upper endoscopy was essentially normal. Testing for Helicobacter pylori was negative. She was maintained on PPI therapy for control of GERD symptoms. She continues on Nexium once daily with no GERD symptoms. She has no GI complaints. She does inquire regarding her liver. She had multiple viral and non-viral studies performed which were negative. Ultrasound revealed increased echogenicity of the liver while CT scan imaging was negative. Non-GI complains today include joint aches, atypical chest pain, and headache. She has a new PCP since her last visit   GI Review of Systems    Reports abdominal pain, chest pain, and  vomiting blood.     Location of  Abdominal pain: LUQ.    Denies acid reflux, belching, bloating, dysphagia with liquids, dysphagia with solids, heartburn, loss of appetite, nausea, vomiting, weight loss, and  weight gain.        Denies anal fissure, black tarry stools, change in bowel habit, constipation,  diarrhea, diverticulosis, fecal incontinence, heme positive stool, hemorrhoids, irritable bowel syndrome, jaundice, light color stool, liver problems, rectal bleeding, and  rectal pain.    Current Medications (verified): 1)  Nexium 40 Mg Cpdr (Esomeprazole Magnesium) .... Take 1 Capsule 30 Min Before Breakfast  Allergies (verified): No Known Drug Allergies  Past History:  Past Surgical History: Last updated: 05/10/2009 Unremarkable    Past Medical History: ALLERGIC RHINITIS (ICD-477.9)   GERD  Family History: Reviewed history from 05/10/2009 and no changes required. Father has liver cyst/tumor? Family History High cholesterol - mother  Family History Hypertension - mother no colon ca no stomach ca     Social History: Reviewed history from 05/10/2009 and no changes required. Occupation: Conservation officer, nature in [employer] Single  1 son  age 66 - lives with grandparents in [place] Never Smoked  Alcohol use-no   Review of Systems       The patient complains of arthritis/joint pain, fatigue, and shortness of breath.  The patient denies allergy/sinus, anemia, anxiety-new, back pain, blood in urine, breast changes/lumps, change in vision, confusion, cough, coughing up blood, depression-new, fainting, fever, headaches-new, hearing problems, heart murmur, heart rhythm changes, itching, menstrual pain, muscle pains/cramps, night sweats, nosebleeds, pregnancy symptoms, skin rash, sleeping problems, sore throat, swelling of feet/legs, swollen lymph glands, thirst - excessive , urination - excessive , urination changes/pain, urine leakage, vision changes, and voice change.    Vital Signs:  Patient profile:   40 year old female Height:      62.5 inches Weight:      142 pounds BMI:     25.65  BSA:     1.66 Pulse rate:   80 / minute Pulse rhythm:   regular BP sitting:   110 / 60  (left arm)  Vitals Entered By: Merri Ray CMA Duncan Dull) (March 08, 2010 8:45 AM)  Physical Exam  General:  Well  developed, well nourished, no acute distress. Head:  Normocephalic and atraumatic. Eyes:  PERRLA, no icterus. Mouth:  No deformity or lesions, dentition normal. Neck:  Supple; no masses or thyromegaly. Lungs:  Clear throughout to auscultation. Heart:  Regular rate and rhythm; no murmurs, rubs,  or bruits. Abdomen:  Soft, nontender and nondistended. No masses, hepatosplenomegaly or hernias noted. Normal bowel sounds. Msk:  Symmetrical with no gross deformities. Normal posture. Pulses:  Normal pulses noted. Extremities:  No clubbing, cyanosis, edema or deformities noted. Neurologic:  Alert and  oriented x4;  grossly normal neurologically. No asterixis. Skin:  Intact without significant lesions or rashes. Psych:  Alert and cooperative. Normal mood and affect.   Impression & Recommendations:  Problem # 1:  COLONIC POLYPS, HX OF (ICD-V12.72)  history of adenomatous colon polyps. no recurrent bleeding since colonoscopy. due for routine followup around July 2014  Problem # 2:  GERD (ICD-530.81)  GERD. Symptoms controlled with once daily Nexium. continue Nexium and reflux precautions  Problem # 3:  TRANSAMINASES, SERUM, ELEVATED (ICD-790.4)  mild elevation of hepatic transaminases. negative workup as described. suspect fatty liver. we'll obtain followup laboratories today including CBC, comprehensive metabolic panel, and PT/INR. Recommended to lose weight gradually towards ideal body weight and regular sensible exercise  Problem # 4:   multiple non-GI complaints.  referred back to her primary provider, who she apparently has an appointment with in the near future.  Other Orders: TLB-CBC Platelet - w/Differential (85025-CBCD) TLB-CMP (Comprehensive Metabolic Pnl) (80053-COMP) TLB-PT (Protime) (85610-PTP)  Patient Instructions: 1)  Labs ordered for you to have drawn today on basement floor. 2)  Please schedule a follow-up appointment as needed.  3)  Copy sent to : [facility],MD -  [address] , [place], [place] ,  [address] 4)  The medication list was reviewed and reconciled.  All changed / newly prescribed medications were explained.  A complete medication list was provided to the patient / caregiver.

## 2011-02-27 ENCOUNTER — Other Ambulatory Visit: Payer: Self-pay | Admitting: Internal Medicine

## 2011-02-27 DIAGNOSIS — R945 Abnormal results of liver function studies: Secondary | ICD-10-CM

## 2011-03-01 ENCOUNTER — Ambulatory Visit
Admission: RE | Admit: 2011-03-01 | Discharge: 2011-03-01 | Disposition: A | Discharge status: Home / Self Care | Payer: BC Managed Care – PPO | Source: Ambulatory Visit | Attending: Internal Medicine | Admitting: Internal Medicine

## 2011-03-01 DIAGNOSIS — R945 Abnormal results of liver function studies: Secondary | ICD-10-CM

## 2011-03-27 ENCOUNTER — Ambulatory Visit (INDEPENDENT_AMBULATORY_CARE_PROVIDER_SITE_OTHER): Payer: BC Managed Care – PPO | Admitting: Internal Medicine

## 2011-03-27 ENCOUNTER — Encounter: Payer: Self-pay | Admitting: Internal Medicine

## 2011-03-27 VITALS — BP 102/70 | HR 72 | Ht 63.0 in | Wt 145.6 lb

## 2011-03-27 DIAGNOSIS — R198 Other specified symptoms and signs involving the digestive system and abdomen: Secondary | ICD-10-CM

## 2011-03-27 DIAGNOSIS — K219 Gastro-esophageal reflux disease without esophagitis: Secondary | ICD-10-CM

## 2011-03-27 DIAGNOSIS — Z86010 Personal history of colonic polyps: Secondary | ICD-10-CM

## 2011-03-27 DIAGNOSIS — K625 Hemorrhage of anus and rectum: Secondary | ICD-10-CM

## 2011-03-27 DIAGNOSIS — R1084 Generalized abdominal pain: Secondary | ICD-10-CM

## 2011-03-27 MED ORDER — PEG-KCL-NACL-NASULF-NA ASC-C 100 G PO SOLR: 1.0000 | Freq: Once | ORAL | Status: DC

## 2011-03-27 NOTE — Patient Instructions (Signed)
You have been scheduled for an endoscopy and colonoscopy with propofol. Please follow written instructions given to you at your visit today.

## 2011-03-27 NOTE — Progress Notes (Signed)
HISTORY OF PRESENT ILLNESS:  Rachael Harmon is a 41 y.o. female ( non-English speaking [place]) who presents today with chief complaint of lower abdominal pain. She is accompanied by a Nurse, learning disability. She was initially seen in this office April 2011 regarding rectal bleeding and elevated Helicobacter pylori antibody and mildly elevated hepatic transaminases. She subsequently underwent colonoscopy and upper endoscopy July 2011. Colonoscopy revealed multiple adenomatous polyps, some of which were large and located in the rectum and sigmoid colon. Possible cause for bleeding. Upper endoscopy was normal with negative H. Pylori testing. She had been on Nexium for GERD symptoms. Elevated liver tests were felt secondary to fatty liver. She was last seen in the office March 2012 with multiple complaints (many non-GI). She that dictation. Repeat liver tests at that time were normal. Today, she reports a 3-4 month history of lower abdominal discomfort. It is described as sharp, generally lasting 10-20 minutes and occurring about twice per week. Some aching in the left back. For similar complaints, negative CT scan of the abdomen and pelvis in May of 2011. She cannot identify any exacerbating or relieving factors. Not affected by meals. She describes change in bowel habits, with 3-4 bowel movements per day, occasionally loose. Also occasional minor rectal bleeding. She also reports occasional upper abdominal discomfort and burning. She is no longer on PPI. She does tell me that she underwent a GYN evaluation. She apparently has uterine fibroids, by her report. However, no GYN cause for her symptoms found. GI evaluation recommended. She tells me that she had a pelvic ultrasound. I do see an abdominal ultrasound dated 02/27/2011 which was normal except for fatty liver and incidental gallbladder polyp. Patient is anxious and wishes to have GI endoscopy. She denies nausea, vomiting, weight loss.  REVIEW OF SYSTEMS:  All non-GI ROS  negative except for sinus allergies, back pain  Past Medical History  Diagnosis Date  . GERD (gastroesophageal reflux disease)     History reviewed. No pertinent past surgical history.  Social History Rachael Harmon  reports that she has never smoked. She has never used smokeless tobacco. She reports that she does not drink alcohol or use illicit drugs.  family history includes Hyperlipidemia in her mother and Hypertension in her mother.  No Known Allergies     PHYSICAL EXAMINATION: Vital signs: BP 102/70  Pulse 72  Ht 5\' 3"  (1.6 m)  Wt 145 lb 9.6 oz (66.044 kg)  BMI 25.79 kg/m2  LMP 03/26/2011  Constitutional: generally well-appearing, no acute distress Psychiatric: alert and oriented x3, cooperative Eyes: extraocular movements intact, anicteric, conjunctiva pink Mouth: oral pharynx moist, no lesions Neck: supple no lymphadenopathy Cardiovascular: heart regular rate and rhythm, no murmur Lungs: clear to auscultation bilaterally Abdomen: soft, nontender, nondistended, no obvious ascites, no peritoneal signs, normal bowel sounds, no organomegaly Rectal:deferred until colonoscopy Extremities: no lower extremity edema bilaterally Skin: no lesions on visible extremities Neuro: No focal deficits.  ASSESSMENT:  #1. Vague lower abdominal discomfort of uncertain cause. Reports negative GYN assessment #2. Change in bowel habits with minor rectal bleeding #3. History of multiple adenomatous polyps #4. GERD #5. Fatty liver. Normal liver tests most recently. Ultrasound as noted   PLAN:  #1. Colonoscopy to evaluate pain, change in bowel habits, and minor bleeding. As well, interval surveillance given history of multiple adenomatous polyps.The nature of the procedure, as well as the risks, benefits, and alternatives were carefully and thoroughly reviewed with the patient. Ample time for discussion and questions allowed. The patient understood, was satisfied,  and agreed to proceed. Movi  prep prescribed. The patient instructed on its use #2. Reflux precautions with attention to weight loss #3. Upper endoscopy to evaluate intermittent upper abdominal pain.The nature of the procedure, as well as the risks, benefits, and alternatives were carefully and thoroughly reviewed with the patient. Ample time for discussion and questions allowed. The patient understood, was satisfied, and agreed to proceed.  #4. May need to resume PPI for dyspeptic symptoms. Will discuss post endoscopy

## 2011-04-07 IMAGING — CT CT ABD-PELV W/ CM
2 of 4 series · 16 of 46 positions shown, 18 images · IV contrast (agent unspecified)
Comparison: None.

CLINICAL DATA: Back pain and hematuria

CT ABDOMEN AND PELVIS WITH CONTRAST
TECHNIQUE: Multidetector CT imaging of the abdomen and pelvis was
performed following the standard protocol during bolus
administration of intravenous contrast.
Contrast: 100 ml [id number]

[Series 2: abd/pelvis 5.0 b31f · axial · 0.60mm/px · z∈[+684,+1034]mm · 13 of 78 slices shown, 15 images]
[im 4/78  soft-tissue]
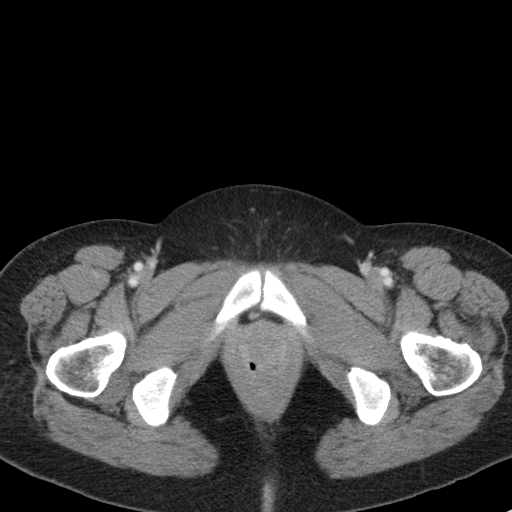
[im 4/78  bone]
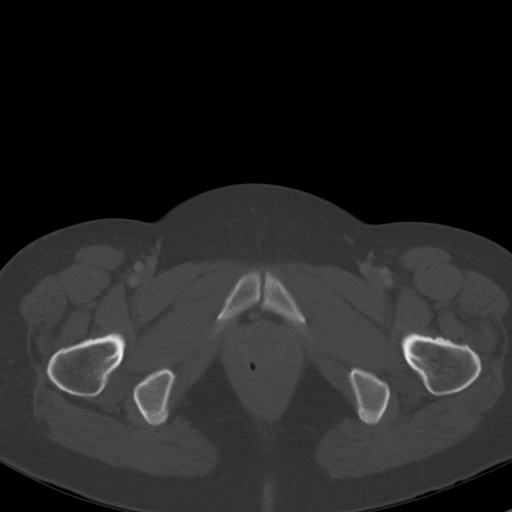
[im 11/78  soft-tissue]
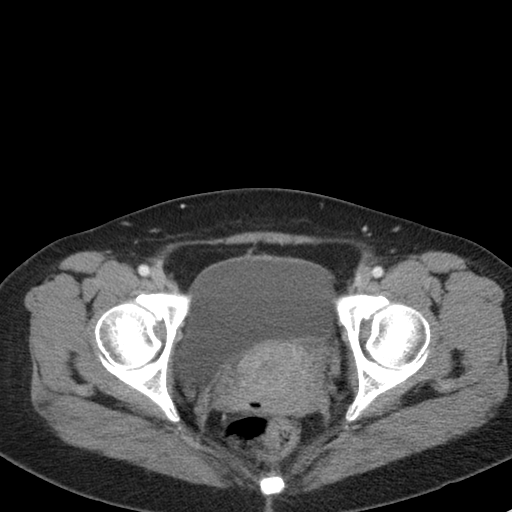
[im 17/78  soft-tissue]
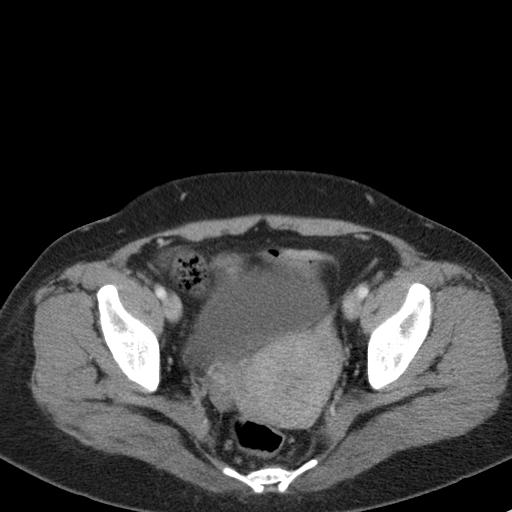
[im 21/78  soft-tissue]
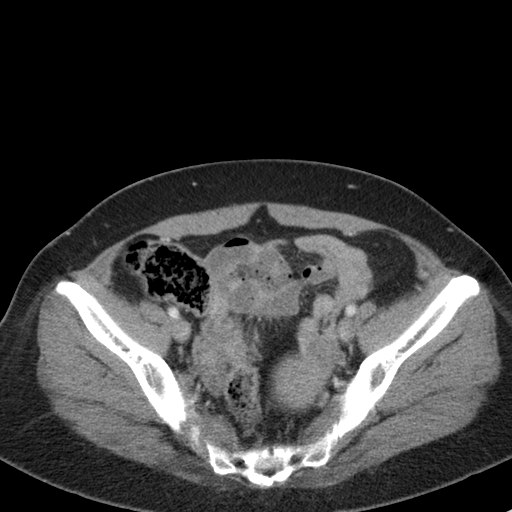
[im 27/78  soft-tissue]
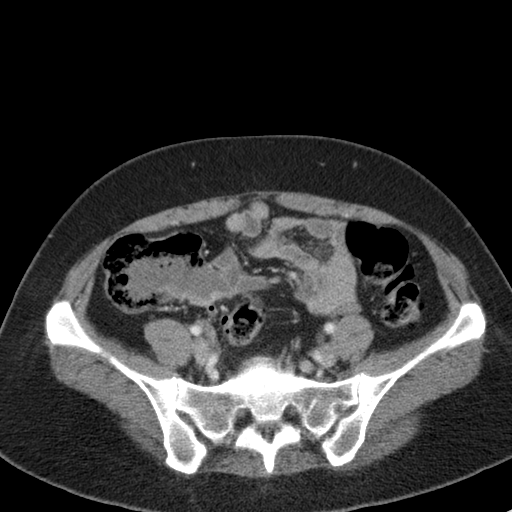
[im 34/78  soft-tissue]
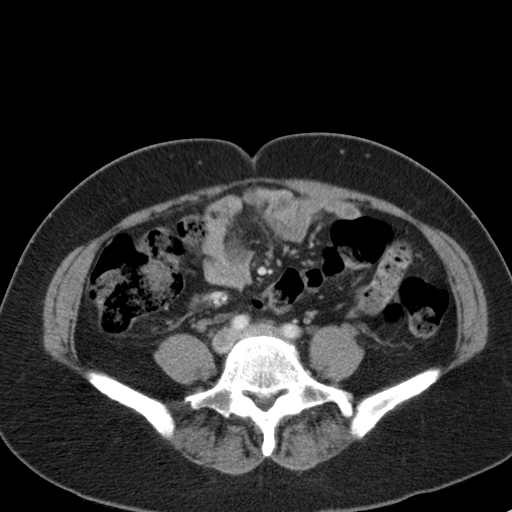
[im 41/78  soft-tissue]
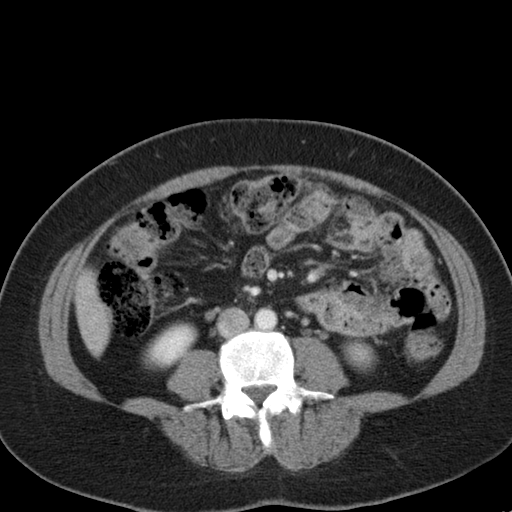
[im 44/78  soft-tissue]
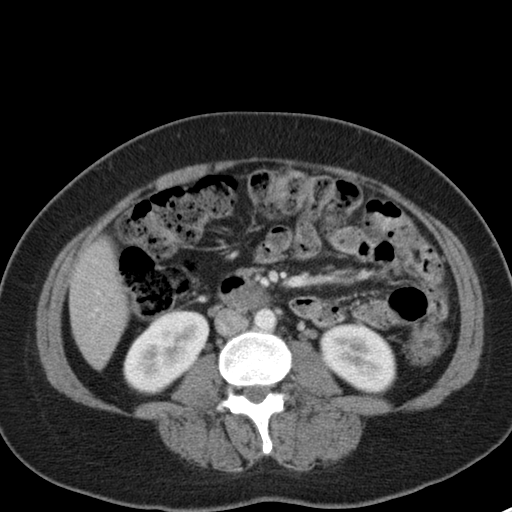
[im 51/78  soft-tissue]
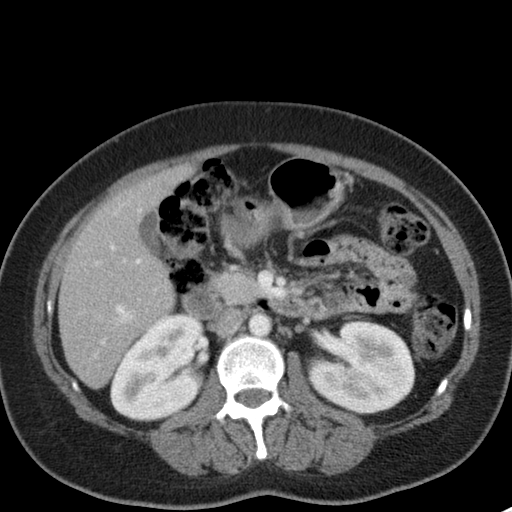
[im 51/78  bone]
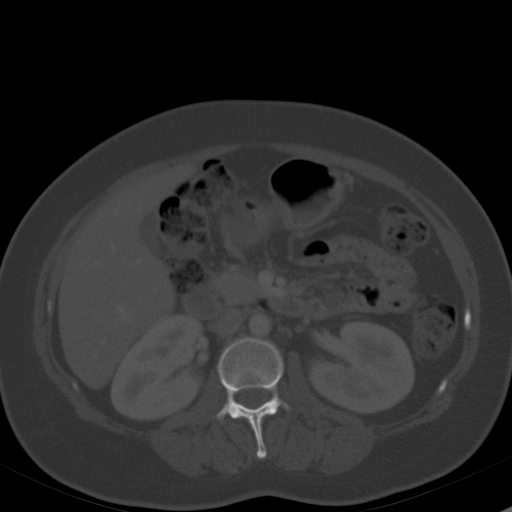
[im 57/78  soft-tissue]
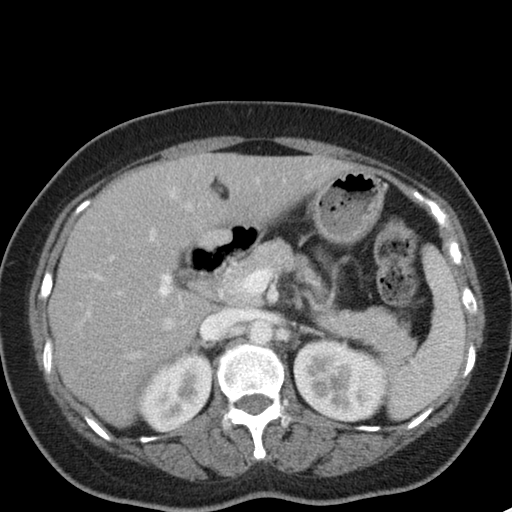
[im 61/78  soft-tissue]
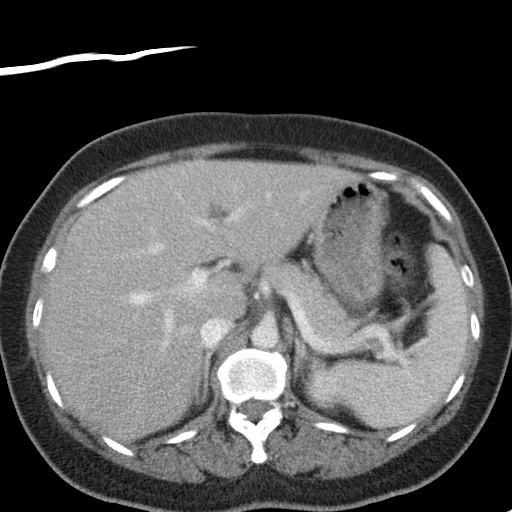
[im 67/78  soft-tissue]
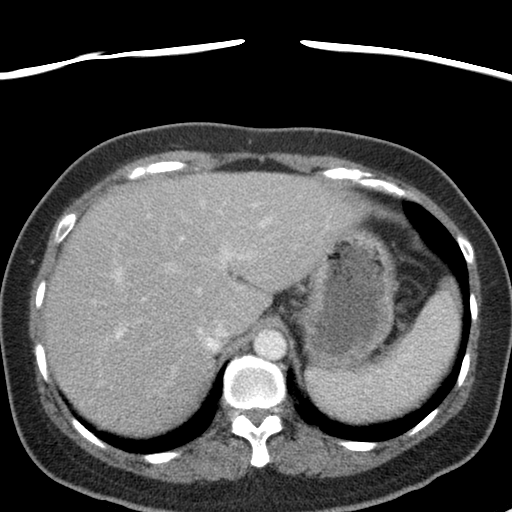
[im 74/78  soft-tissue]
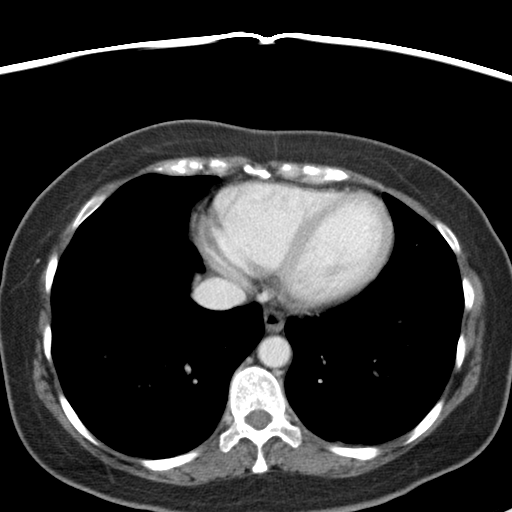

[Series 5: abd/pelvis 3.0 coronal · coronal · 0.64mm/px · 3 of 81 slices shown]
[im 27/81  soft-tissue]
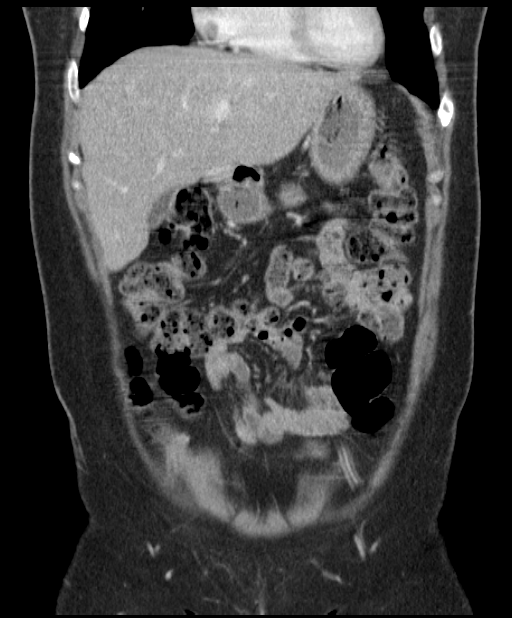
[im 36/81  soft-tissue]
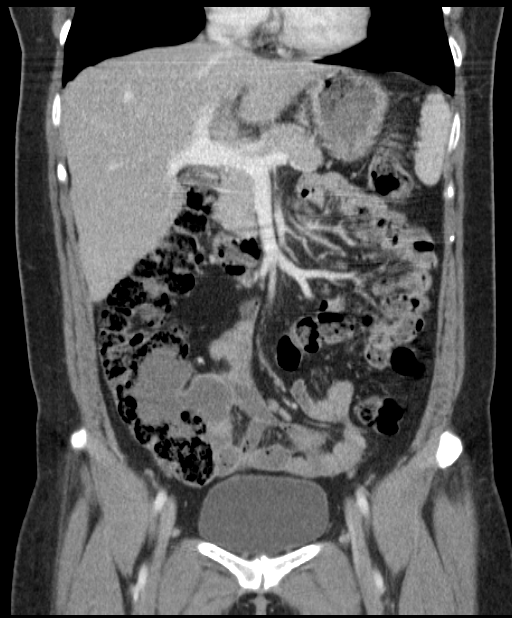
[im 45/81  soft-tissue]
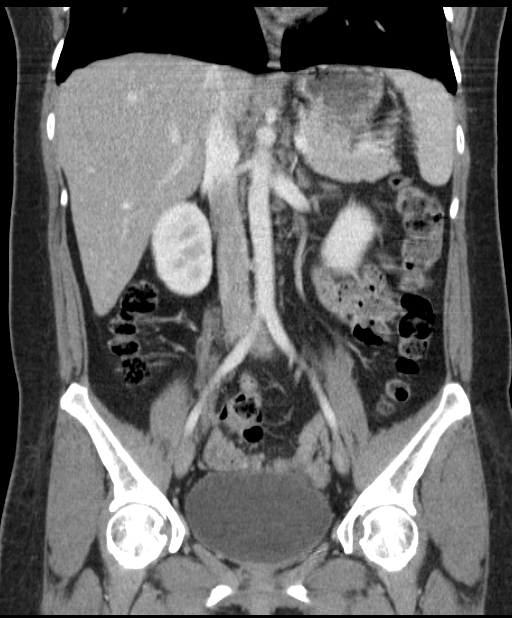

[16 of 46 positions shown; findings below may reference images not displayed]

FINDINGS: There is no focal abnormality in the liver or spleen.
The stomach, duodenum, pancreas, gallbladder, adrenal glands, and
kidneys have normal imaging features.  Specifically, there are no
renal calculi.  No renal mass.  No hydronephrosis.  There is no
ureteral stone.  No bladder calculi.

No abdominal aortic aneurysm.  No abdominal lymphadenopathy.  No
abdominal free fluid.  The abdominal bowel loops have normal
imaging features.

No pelvic lymphadenopathy.  Bladder is unremarkable.  Uterus is
normal.  No adnexal mass.

No substantial diverticulosis of the colon.  There is no
diverticulitis.  The terminal ileum is normal.  The appendix is
normal.

Bone windows reveal no worrisome lytic or sclerotic osseous
lesions.
IMPRESSION: Normal CT scan of the abdomen and pelvis.  Specifically, there is
no evidence for kidney stone, ureteral stone, or bladder stone.  No
renal mass.  No hydronephrosis.

## 2011-05-08 ENCOUNTER — Other Ambulatory Visit: Payer: Self-pay

## 2011-05-08 DIAGNOSIS — K219 Gastro-esophageal reflux disease without esophagitis: Secondary | ICD-10-CM

## 2011-05-08 MED ORDER — PEG-KCL-NACL-NASULF-NA ASC-C 100 G PO SOLR: 1.0000 | Freq: Once | ORAL | Status: DC

## 2011-05-10 ENCOUNTER — Encounter: Payer: Self-pay | Admitting: Internal Medicine

## 2011-05-10 ENCOUNTER — Ambulatory Visit (AMBULATORY_SURGERY_CENTER): Payer: BC Managed Care – PPO | Admitting: Internal Medicine

## 2011-05-10 VITALS — BP 121/78 | HR 70 | Temp 98.2°F | Resp 16 | Ht 63.0 in | Wt 145.0 lb

## 2011-05-10 DIAGNOSIS — R1084 Generalized abdominal pain: Secondary | ICD-10-CM

## 2011-05-10 DIAGNOSIS — R1013 Epigastric pain: Secondary | ICD-10-CM

## 2011-05-10 DIAGNOSIS — Z86010 Personal history of colonic polyps: Secondary | ICD-10-CM

## 2011-05-10 DIAGNOSIS — D126 Benign neoplasm of colon, unspecified: Secondary | ICD-10-CM

## 2011-05-10 DIAGNOSIS — K219 Gastro-esophageal reflux disease without esophagitis: Secondary | ICD-10-CM

## 2011-05-10 DIAGNOSIS — K625 Hemorrhage of anus and rectum: Secondary | ICD-10-CM

## 2011-05-10 DIAGNOSIS — Z1211 Encounter for screening for malignant neoplasm of colon: Secondary | ICD-10-CM

## 2011-05-10 MED ORDER — SODIUM CHLORIDE 0.9 % IV SOLN: 500.0000 mL | INTRAVENOUS | Status: DC

## 2011-05-10 MED ORDER — OMEPRAZOLE 20 MG PO CPDR: 20.0000 mg | DELAYED_RELEASE_CAPSULE | Freq: Every day | ORAL | Status: AC

## 2011-05-10 NOTE — Progress Notes (Signed)
Patient did not have preoperative order for IV antibiotic SSI prophylaxis. (G8918)  Patient did not experience any of the following events: a burn prior to discharge; a fall within the facility; wrong site/side/patient/procedure/implant event; or a hospital transfer or hospital admission upon discharge from the facility. (G8907)  

## 2011-05-10 NOTE — Op Note (Signed)
[facility] [address] [place], [place]  [address]  COLONOSCOPY PROCEDURE REPORT  PATIENT:  Rachael Harmon, Rachael Harmon  MR#:  [id number] BIRTHDATE:  09-18-1970, 41 yrs. old  GENDER:  female ENDOSCOPIST:  Wilhemina Bonito. Eda Keys, MD REF. BY:  Office / Self PROCEDURE DATE:  05/10/2011 PROCEDURE:  Colonoscopy with snare polypectomy x1 ASA CLASS:  Class II INDICATIONS:  history of pre-cancerous (adenomatous) colon polyps, surveillance and high-risk screening ; index exam 07-2009 (multiple, large, TAs). also c/o lower abdominal pain and minor rectal bleeding MEDICATIONS:   MAC sedation, administered by CRNA, propofol (Diprivan) 300 mg IV  DESCRIPTION OF PROCEDURE:   After the risks benefits and alternatives of the procedure were thoroughly explained, informed consent was obtained.  Digital rectal exam was performed and revealed no abnormalities.   The LB [id number] endoscope was introduced through the anus and advanced to the cecum, which was identified by both the appendix and ileocecal valve, without limitations.  The quality of the prep was excellent, using MoviPrep.  The instrument was then slowly withdrawn as the colon was fully examined. <<PROCEDUREIMAGES>>  FINDINGS:  A diminutive polyp was found in the ascending colon and was snared without cautery. Retrieval was successful. Otherwise normal colonoscopy without other polyps, masses, vascular ectasias, or inflammatory changes.   Retroflexed views in the rectum revealed internal hemorrhoids.    The time to cecum = 2:52 minutes. The scope was then withdrawn in  12:35  minutes from the cecum and the procedure completed.  COMPLICATIONS:  None  ENDOSCOPIC IMPRESSION: 1) Diminutive polyp in the ascending colon - removed 2) Otherwise normal colonoscopy 3) Internal hemorrhoids  RECOMMENDATIONS: 1) Follow up colonoscopy in 5 years 2) Upper endoscopy today  ______________________________ Wilhemina Bonito. Eda Keys, MD  CC:  The Patient;   Karie Mainland, MD ([place])  n. eSIGNED:   Wilhemina Bonito. Eda Keys at 05/10/2011 03:40 PM  Marcelline Mates, [phone]

## 2011-05-10 NOTE — Op Note (Signed)
[facility] [address] [place], [place]  [address]  ENDOSCOPY PROCEDURE REPORT  PATIENT:  Rachael Harmon, Rachael Harmon  MR#:  [id number] BIRTHDATE:  [birth year], 41 yrs. old  GENDER:  female  ENDOSCOPIST:  Wilhemina Bonito. Eda Keys, MD Referred by:  Office  PROCEDURE DATE:  05/10/2011 PROCEDURE:  EGD, diagnostic 19147 ASA CLASS:  Class II INDICATIONS:  abdominal pain  MEDICATIONS:   MAC sedation, administered by CRNA, propofol (Diprivan) 150 mg IV TOPICAL ANESTHETIC:  none  DESCRIPTION OF PROCEDURE:   After the risks benefits and alternatives of the procedure were thoroughly explained, informed consent was obtained.  The LB GIF-H180 [id number] endoscope was introduced through the mouth and advanced to the second portion of the duodenum, without limitations.  The instrument was slowly withdrawn as the mucosa was fully examined. <<PROCEDUREIMAGES>>  The upper, middle, and distal third of the esophagus were carefully inspected and no abnormalities were noted. The z-line was well seen at the GEJ. The endoscope was pushed into the fundus which was normal including a retroflexed view. The antrum,gastric body, first and second part of the duodenum were unremarkable. Retroflexed views revealed no abnormalities.    The scope was then withdrawn from the patient and the procedure completed.  COMPLICATIONS:  None  ENDOSCOPIC IMPRESSION: 1) Normal EGD 2) GERD  RECOMMENDATIONS: 1) Anti-reflux regimen to be followed 2) OMEPRAZOLE 20MG ; #30; ONE PO QD (11 REFILLS) 3) OFFICE VISIT WITH DR Marina Goodell IN 3 MONTHS  ______________________________ Wilhemina Bonito. Eda Keys, MD  CC:  The Patient; [facility])  n. eSIGNED:   Wilhemina Bonito. Eda Keys at 05/10/2011 03:46 PM  Marcelline Mates, [phone]

## 2011-05-10 NOTE — Patient Instructions (Signed)

## 2011-05-11 ENCOUNTER — Telehealth: Payer: Self-pay | Admitting: *Deleted

## 2011-05-16 ENCOUNTER — Encounter: Payer: Self-pay | Admitting: Internal Medicine

## 2011-08-03 ENCOUNTER — Ambulatory Visit (INDEPENDENT_AMBULATORY_CARE_PROVIDER_SITE_OTHER): Payer: BC Managed Care – PPO | Admitting: Internal Medicine

## 2011-08-03 ENCOUNTER — Encounter: Payer: Self-pay | Admitting: Internal Medicine

## 2011-08-03 VITALS — BP 128/74 | HR 64 | Ht 63.0 in | Wt 146.0 lb

## 2011-08-03 DIAGNOSIS — K219 Gastro-esophageal reflux disease without esophagitis: Secondary | ICD-10-CM

## 2011-08-03 DIAGNOSIS — K648 Other hemorrhoids: Secondary | ICD-10-CM

## 2011-08-03 DIAGNOSIS — K7689 Other specified diseases of liver: Secondary | ICD-10-CM

## 2011-08-03 DIAGNOSIS — Z86010 Personal history of colonic polyps: Secondary | ICD-10-CM

## 2011-08-03 DIAGNOSIS — R1084 Generalized abdominal pain: Secondary | ICD-10-CM

## 2011-08-03 DIAGNOSIS — K760 Fatty (change of) liver, not elsewhere classified: Secondary | ICD-10-CM

## 2011-08-03 NOTE — Progress Notes (Signed)
HISTORY OF PRESENT ILLNESS:  Rachael Harmon is a 41 y.o. female ( non-English speaking [place]) who presents today for followup of abdominal pain. She is accompanied by a Nurse, learning disability. She was initially seen in this office April 2011 regarding rectal bleeding and elevated Helicobacter pylori antibody and mildly elevated hepatic transaminases. She subsequently underwent colonoscopy and upper endoscopy July 2011. Colonoscopy revealed multiple adenomatous polyps, some of which were large and located in the rectum and sigmoid colon. Possible cause for bleeding. Upper endoscopy was normal with negative H. Pylori testing. She had been on Nexium for GERD symptoms. Elevated liver tests were felt secondary to fatty liver. She was then seen in the office March 2012 with multiple complaints (many non-GI). She that dictation. Repeat liver tests at that time were normal. She was last seen in the office 03/27/2011 for abdominal complaints. See that dictation.. For similar complaints previously, negative CT scan of the abdomen and pelvis in May of 2011. Also, abdominal ultrasound dated 02/27/2011 was normal except for fatty liver and incidental gallbladder polyp. To evaluate her complaints of abdominal pain/discomfort, change in bowel habits with minor bleeding, a history of multiple adenomatous polyps, and GERD, she underwent colonoscopy and upper endoscopy on 05/10/2011. Colonoscopy was normal except for diminutive polyps and internal hemorrhoids. Upper endoscopy was entirely normal. Routine followup colonoscopy in 5 years recommended. Her upper abdominal pain was felt to be secondary to GERD. She was placed on omeprazole 20 mg daily. She took a medication for one month. Her problems with pain essentially resolved. Despite having multiple refills, she was subsequently placed on Nexium by her primary provider. She continues on the medication and follows up at this time. No active GI complaints. She tolerated the medication  well.   REVIEW OF SYSTEMS:  All non-GI ROS negative except for a number of items including arthritis, back pain, cough, fatigue, anxiety, itching, muscle cramps, insomnia, sore throat, ankle swelling, increased thirst, increased urinary frequency, urinary leakage  Past Medical History  Diagnosis Date  . GERD (gastroesophageal reflux disease)   . Allergy     HEY FEVER.  . Internal hemorrhoids   . Hx of colonic polyps   . Fatty liver     [place]     No past surgical history on file.  Social History Rachael Harmon  reports that she has never smoked. She has never used smokeless tobacco. She reports that she does not drink alcohol or use illicit drugs.  family history includes Hyperlipidemia in her mother and Hypertension in her mother.  There is no history of Colon cancer.  No Known Allergies     PHYSICAL EXAMINATION: Vital signs: BP 128/74  Pulse 64  Ht 5\' 3"  (1.6 m)  Wt 146 lb (66.225 kg)  BMI 25.86 kg/m2 General: Well-developed, well-nourished, no acute distress HEENT: Sclerae are anicteric, conjunctiva pink. Oral mucosa intact Lungs: Clear Heart: Regular Abdomen: soft, nontender, nondistended, no obvious ascites, no peritoneal signs, normal bowel sounds. No organomegaly. Extremities: No edema Psychiatric: alert and oriented x3. Cooperative    ASSESSMENT:  #1. GERD. Responsible for her upper GI complaints. Better on PPI #2. History of multiple adenomatous colon polyps. Recent surveillance colonoscopy as described #3. History of minor rectal bleeding due to hemorrhoids #4. Fatty liver #5. Multiple somatic complaints   PLAN:  #1. Reflux precautions #2. Continue PPI #3. Exercise and weight loss #4. Routine GI followup in 1 year #5. Surveillance colonoscopy in 5 years #6. Return your primary provider to address somatic complaints that are  non-GI

## 2011-08-03 NOTE — Patient Instructions (Addendum)
Please follow up in one year 

## 2014-06-15 ENCOUNTER — Encounter: Payer: Self-pay | Admitting: Internal Medicine

## 2014-07-14 ENCOUNTER — Other Ambulatory Visit: Payer: Self-pay

## 2014-07-14 ENCOUNTER — Other Ambulatory Visit: Payer: Self-pay | Admitting: Internal Medicine

## 2014-07-14 DIAGNOSIS — Z1231 Encounter for screening mammogram for malignant neoplasm of breast: Secondary | ICD-10-CM

## 2014-07-23 ENCOUNTER — Ambulatory Visit
Admission: RE | Admit: 2014-07-23 | Discharge: 2014-07-23 | Disposition: A | Discharge status: Home / Self Care | Payer: 59 | Source: Ambulatory Visit | Attending: Internal Medicine | Admitting: Internal Medicine

## 2014-07-23 DIAGNOSIS — Z1231 Encounter for screening mammogram for malignant neoplasm of breast: Secondary | ICD-10-CM

## 2015-08-05 ENCOUNTER — Other Ambulatory Visit: Payer: Self-pay | Admitting: Internal Medicine

## 2015-08-05 DIAGNOSIS — Z1231 Encounter for screening mammogram for malignant neoplasm of breast: Secondary | ICD-10-CM

## 2015-08-09 ENCOUNTER — Other Ambulatory Visit: Payer: Self-pay | Admitting: Obstetrics and Gynecology

## 2015-08-09 DIAGNOSIS — E041 Nontoxic single thyroid nodule: Secondary | ICD-10-CM

## 2015-08-17 ENCOUNTER — Ambulatory Visit
Admission: RE | Admit: 2015-08-17 | Discharge: 2015-08-17 | Disposition: A | Discharge status: Home / Self Care | Payer: BLUE CROSS/BLUE SHIELD | Source: Ambulatory Visit | Attending: Obstetrics and Gynecology | Admitting: Obstetrics and Gynecology

## 2015-08-17 DIAGNOSIS — E041 Nontoxic single thyroid nodule: Secondary | ICD-10-CM

## 2015-08-23 ENCOUNTER — Other Ambulatory Visit: Payer: Self-pay | Admitting: Obstetrics and Gynecology

## 2015-08-23 DIAGNOSIS — E041 Nontoxic single thyroid nodule: Secondary | ICD-10-CM

## 2015-09-16 ENCOUNTER — Other Ambulatory Visit (HOSPITAL_COMMUNITY)
Admission: RE | Admit: 2015-09-16 | Discharge: 2015-09-16 | Disposition: A | Discharge status: Home / Self Care | Payer: BLUE CROSS/BLUE SHIELD | Source: Ambulatory Visit | Attending: Interventional Radiology | Admitting: Interventional Radiology

## 2015-09-16 ENCOUNTER — Ambulatory Visit
Admission: RE | Admit: 2015-09-16 | Discharge: 2015-09-16 | Disposition: A | Discharge status: Home / Self Care | Payer: BLUE CROSS/BLUE SHIELD | Source: Ambulatory Visit | Attending: Obstetrics and Gynecology | Admitting: Obstetrics and Gynecology

## 2015-09-16 DIAGNOSIS — E041 Nontoxic single thyroid nodule: Secondary | ICD-10-CM | POA: Insufficient documentation

## 2015-09-16 NOTE — Procedures (Signed)
Interventional Radiology Procedure Note  Procedure: Left thyroid nodule biopsy/FNA.  1.8cm lower left. No AFIRMA.  Complications: None Recommendations:  - Ok to shower tomorrow - OK for full activity.  - OK for all medicines.  - Routine care   Signed,  Dulcy Fanny. Earleen Newport, DO

## 2015-09-21 ENCOUNTER — Other Ambulatory Visit: Payer: BLUE CROSS/BLUE SHIELD

## 2015-11-09 ENCOUNTER — Ambulatory Visit: Payer: BLUE CROSS/BLUE SHIELD | Admitting: Internal Medicine

## 2016-04-06 ENCOUNTER — Encounter: Payer: Self-pay | Admitting: Gastroenterology

## 2016-10-09 ENCOUNTER — Other Ambulatory Visit: Payer: Self-pay | Admitting: Obstetrics and Gynecology

## 2016-10-09 DIAGNOSIS — E041 Nontoxic single thyroid nodule: Secondary | ICD-10-CM

## 2016-10-16 ENCOUNTER — Ambulatory Visit
Admission: RE | Admit: 2016-10-16 | Discharge: 2016-10-16 | Disposition: A | Discharge status: Home / Self Care | Payer: BLUE CROSS/BLUE SHIELD | Source: Ambulatory Visit | Attending: Obstetrics and Gynecology | Admitting: Obstetrics and Gynecology

## 2016-10-16 DIAGNOSIS — E041 Nontoxic single thyroid nodule: Secondary | ICD-10-CM

## 2017-12-11 ENCOUNTER — Other Ambulatory Visit: Payer: Self-pay | Admitting: Obstetrics and Gynecology

## 2017-12-11 ENCOUNTER — Other Ambulatory Visit: Payer: BLUE CROSS/BLUE SHIELD

## 2017-12-11 DIAGNOSIS — E041 Nontoxic single thyroid nodule: Secondary | ICD-10-CM

## 2017-12-13 ENCOUNTER — Other Ambulatory Visit: Payer: BLUE CROSS/BLUE SHIELD
